# Patient Record
Sex: Male | Born: 1990 | Hispanic: No | Smoking: Former smoker
Health system: Southern US, Community
[De-identification: ages and names within clinical notes are randomized; demographics above are authoritative.]

---

## 2014-06-08 ENCOUNTER — Ambulatory Visit
Admission: RE | Admit: 2014-06-08 | Discharge: 2014-06-08 | Disposition: A | Discharge status: Home / Self Care | Payer: Worker's Compensation | Source: Ambulatory Visit | Attending: Family | Admitting: Family

## 2014-06-08 ENCOUNTER — Other Ambulatory Visit: Payer: Self-pay | Admitting: Family

## 2014-06-08 DIAGNOSIS — M545 Low back pain: Secondary | ICD-10-CM

## 2016-03-22 ENCOUNTER — Encounter (HOSPITAL_COMMUNITY): Payer: Self-pay

## 2016-03-22 ENCOUNTER — Emergency Department (HOSPITAL_COMMUNITY)
Admission: EM | Admit: 2016-03-22 | Discharge: 2016-03-22 | Disposition: A | Discharge status: Home / Self Care | Payer: Self-pay | Attending: Emergency Medicine | Admitting: Emergency Medicine

## 2016-03-22 DIAGNOSIS — Z79899 Other long term (current) drug therapy: Secondary | ICD-10-CM | POA: Insufficient documentation

## 2016-03-22 DIAGNOSIS — F329 Major depressive disorder, single episode, unspecified: Secondary | ICD-10-CM | POA: Insufficient documentation

## 2016-03-22 DIAGNOSIS — Z87891 Personal history of nicotine dependence: Secondary | ICD-10-CM | POA: Insufficient documentation

## 2016-03-22 DIAGNOSIS — R45851 Suicidal ideations: Secondary | ICD-10-CM | POA: Insufficient documentation

## 2016-03-22 DIAGNOSIS — F919 Conduct disorder, unspecified: Secondary | ICD-10-CM | POA: Insufficient documentation

## 2016-03-22 LAB — RAPID URINE DRUG SCREEN, HOSP PERFORMED
AMPHETAMINES: NOT DETECTED
Barbiturates: NOT DETECTED
Benzodiazepines: NOT DETECTED
Cocaine: NOT DETECTED
Opiates: NOT DETECTED
TETRAHYDROCANNABINOL: NOT DETECTED

## 2016-03-22 LAB — COMPREHENSIVE METABOLIC PANEL
ALT: 19 U/L (ref 17–63)
AST: 23 U/L (ref 15–41)
Albumin: 4.9 g/dL (ref 3.5–5.0)
Alkaline Phosphatase: 53 U/L (ref 38–126)
Anion gap: 11 (ref 5–15)
BUN: 15 mg/dL (ref 6–20)
CALCIUM: 10.2 mg/dL (ref 8.9–10.3)
CO2: 24 mmol/L (ref 22–32)
CREATININE: 1.18 mg/dL (ref 0.61–1.24)
Chloride: 105 mmol/L (ref 101–111)
GFR calc non Af Amer: >60 mL/min (ref >60)
GLUCOSE: 85 mg/dL (ref 65–99)
Potassium: 4.3 mmol/L (ref 3.5–5.1)
SODIUM: 140 mmol/L (ref 135–145)
Total Bilirubin: 1 mg/dL (ref 0.3–1.2)
Total Protein: 7.5 g/dL (ref 6.5–8.1)

## 2016-03-22 LAB — CBC WITH DIFFERENTIAL/PLATELET
BASOS PCT: 0 %
Basophils Absolute: 0 10*3/uL (ref 0.0–0.1)
EOS PCT: 3 %
Eosinophils Absolute: 0.3 10*3/uL (ref 0.0–0.7)
HEMATOCRIT: 41.5 % (ref 39.0–52.0)
HEMOGLOBIN: 15.3 g/dL (ref 13.0–17.0)
LYMPHS PCT: 34 %
Lymphs Abs: 2.8 10*3/uL (ref 0.7–4.0)
MCH: 26.7 pg (ref 26.0–34.0)
MCHC: 36.9 g/dL — ABNORMAL HIGH (ref 30.0–36.0)
MCV: 72.6 fL — AB (ref 78.0–100.0)
MONOS PCT: 7 %
Monocytes Absolute: 0.6 10*3/uL (ref 0.1–1.0)
NEUTROS ABS: 4.6 10*3/uL (ref 1.7–7.7)
Neutrophils Relative %: 56 %
Platelets: 230 10*3/uL (ref 150–400)
RBC: 5.72 MIL/uL (ref 4.22–5.81)
RDW: 13.6 % (ref 11.5–15.5)
WBC: 8.3 10*3/uL (ref 4.0–10.5)

## 2016-03-22 LAB — SALICYLATE LEVEL

## 2016-03-22 LAB — ACETAMINOPHEN LEVEL: Acetaminophen (Tylenol), Serum: <10 ug/mL — ABNORMAL LOW (ref 10–30)

## 2016-03-22 LAB — TSH: TSH: 0.552 u[IU]/mL (ref 0.350–4.500)

## 2016-03-22 LAB — ETHANOL: Alcohol, Ethyl (B): <5 mg/dL (ref <5)

## 2016-03-22 NOTE — ED Notes (Signed)
TTS at bedside. 

## 2016-03-22 NOTE — Discharge Instructions (Signed)
After psychiatric evaluation today there is no need for inpatient psychiatric admission at this time. Please refer to the resources to seek psychiatric help on the outpatient basis. If you have thoughts of hurting herself or suicide again please contact care crisis team or come back to the emergency department.

## 2016-03-22 NOTE — ED Triage Notes (Signed)
Pt broke in by mobile crisis unit for psych eval.  Pt denies SI/HI at this time.  Pt reports anger issues, mood swings, and depression.
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
## 2016-03-22 NOTE — BH Assessment (Signed)
Tele Assessment Note   Gregory FillerChristopher Leonard is an 25 y.o. male who presents to the ED voluntarily. Pt reports he wants to be evaluated due to feeling suicidal today. Pt reports he drinks heavily during social occassions and sometimes acts out in fits of rage and anger. Pt reports yesterday he went to a birthday party and he was drinking in excess and became angry and aggressive. Pt reports he gets angry with his girlfriend and his 27 year old daughter. Pt denies any physical violence due to the anger and reports he has never "hit them" however he reports he gets frustrated and anger and experiences mood swings.   Pt reports when he is drinking in excess it affects his personal relationships and pt reports he "wants help." Pt denies AVH and reports he is not currently feeling suicidal. Pt denies any plan and reports he has never engaged in self-harming behaviors. Pt reports he used to attend anger management when he was in high school but he did not stick with it. Pt requests resources for OPT including anger management, alcohol abuse treatment, and family therapy. Pt reports he does not feel that he can control his emotions and has a short temper when he gets angry and takes his frustrations out on other people. Pt reports his drinking exacerbates his condition and mood swings.   Pt does not meet inpt criteria per Nira ConnJason Berry, FNP. Pt tp be d/c with OPT resources. Docia FurlKaleigh M Brown, RN has been notified and the pt has been given OPT resources.   Diagnosis: Substance Induced Mood Disorder, Alcohol Use Disorder   Past Medical History: History reviewed. No pertinent past medical history.  History reviewed. No pertinent surgical history.  Family History: History reviewed. No pertinent family history.  Social History:  reports that he has quit smoking. He has never used smokeless tobacco. He reports that he drinks about 3.6 oz of alcohol per week . He reports that he does not use drugs.  Additional Social  History:  Alcohol / Drug Use Pain Medications: Pt denies abuse  Prescriptions: Pt denies abuse  Over the Counter: Pt denies abuse  History of alcohol / drug use?: Yes Longest period of sobriety (when/how long): none Negative Consequences of Use: Personal relationships Substance #1 Name of Substance 1: Alcohol 1 - Age of First Use: 14 1 - Amount (size/oz): "too much" 1 - Frequency: 3x/ week 1 - Duration: ongoing 1 - Last Use / Amount: 03/21/16  CIWA: CIWA-Ar BP: 142/89 Pulse Rate: 79 COWS:    PATIENT STRENGTHS: (choose at least two) Average or above average intelligence Capable of independent living Licensed conveyancerCommunication skills Financial means Motivation for treatment/growth Physical Health Supportive family/friends  Allergies: No Known Allergies  Home Medications:  (Not in a hospital admission)  OB/GYN Status:  No LMP for male patient.  General Assessment Data [facility] Assessment: In system Is this a Tele or Face-to-Face Assessment?: Tele Assessment Is this an Initial Assessment or a Re-assessment for this encounter?: Initial Assessment Marital status: Single Is patient pregnant?: No Pregnancy Status: No Living Arrangements: Spouse/significant other, Children Can pt return to current living arrangement?: Yes Admission Status: Voluntary Is patient capable of signing voluntary admission?: Yes Referral Source: Self/Family/Friend Insurance type: BCBS     Crisis Care Plan Living Arrangements: Spouse/significant other, Children Name of Psychiatrist: none Name of Therapist: none  Education Status Is patient currently in school?: No Highest grade of school patient has completed: Bachelor's Degree  Risk to self  with the past 6 months Suicidal Ideation: No-Not Currently/Within Last 6 Months Has patient been a risk to self within the past 6 months prior to admission? : No Suicidal Intent: No Has patient had any suicidal intent within the past 6  months prior to admission? : No Is patient at risk for suicide?: No Suicidal Plan?: No Has patient had any suicidal plan within the past 6 months prior to admission? : No Access to Means: No What has been your use of drugs/alcohol within the last 12 months?: reports to weekly alcohol use  Previous Attempts/Gestures: No Triggers for Past Attempts: None known Intentional Self Injurious Behavior: None Family Suicide History: Yes (pt reports dad has attempted suicide in the past) Recent stressful life event(s): Conflict (Comment), Other (Comment) (conflict w/ girlfriend and excessive alcohol use ) Persecutory voices/beliefs?: No Depression: No Depression Symptoms: Feeling worthless/self pity, Feeling angry/irritable Substance abuse history and/or treatment for substance abuse?: No Suicide prevention information given to non-admitted patients: Not applicable  Risk to Others within the past 6 months Homicidal Ideation: No Does patient have any lifetime risk of violence toward others beyond the six months prior to admission? : No Thoughts of Harm to Others: No Current Homicidal Intent: No Current Homicidal Plan: No Access to Homicidal Means: No History of harm to others?: No Assessment of Violence: In distant past Violent Behavior Description: pt reports he has been violent in the past with others when he was angered Does patient have access to weapons?: No Criminal Charges Pending?: No Does patient have a court date: No Is patient on probation?: No  Psychosis Hallucinations: None noted Delusions: None noted  Mental Status Report Appearance/Hygiene: Unremarkable Eye Contact: Good Motor Activity: Freedom of movement, Unremarkable Speech: Logical/coherent Level of Consciousness: Alert Mood: Ashamed/humiliated, Pleasant Affect: Appropriate to circumstance Anxiety Level: None Thought Processes: Coherent, Relevant Judgement: Unimpaired Orientation: Person, Place, Time, Situation,  Appropriate for developmental age Obsessive Compulsive Thoughts/Behaviors: None  Cognitive Functioning Concentration: Normal Memory: Recent Intact, Remote Intact IQ: Average Insight: Fair Impulse Control: Fair Appetite: Good Sleep: No Change Total Hours of Sleep: 8 Vegetative Symptoms: None  ADLScreening [facility] Assessment Services) Patient's cognitive ability adequate to safely complete daily activities?: Yes Patient able to express need for assistance with ADLs?: Yes Independently performs ADLs?: Yes (appropriate for developmental age)  Prior Inpatient Therapy Prior Inpatient Therapy: No  Prior Outpatient Therapy Prior Outpatient Therapy: Yes Prior Therapy Dates: pt reports in high school Prior Therapy Facilty/Provider(s): unable to recall Reason for Treatment: anger management  Does patient have an ACCT team?: No Does patient have Intensive In-House Services?  : No Does patient have [facility] services? : No Does patient have [facility] services?: No  ADL Screening (condition at time of admission) Patient's cognitive ability adequate to safely complete daily activities?: Yes Is the patient deaf or have difficulty hearing?: No Does the patient have difficulty seeing, even when wearing glasses/contacts?: No Does the patient have difficulty concentrating, remembering, or making decisions?: No Patient able to express need for assistance with ADLs?: Yes Does the patient have difficulty dressing or bathing?: No Independently performs ADLs?: Yes (appropriate for developmental age) Does the patient have difficulty walking or climbing stairs?: No Weakness of Legs: None Weakness of Arms/Hands: None  Home Assistive Devices/Equipment Home Assistive Devices/Equipment: None    Abuse/Neglect Assessment (Assessment to be complete while patient is alone) Physical Abuse: Denies Verbal Abuse: Denies Sexual Abuse: Denies Exploitation of patient/patient's resources: Denies Self-Neglect:  Denies     Merchant navy officerAdvance Directives (For Healthcare)  Does Patient Have a Medical Advance Directive?: No Would patient like information on creating a medical advance directive?: No - Patient declined    Additional Information 1:1 In Past 12 Months?: No CIRT Risk: No Elopement Risk: No Does patient have medical clearance?: Yes     Disposition:  Disposition Initial Assessment Completed for this Encounter: Yes  Karolee Ohs 03/22/2016 7:46 PM
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
